# Patient Record
Sex: Male | Born: 1950
Health system: Southern US, Community
[De-identification: ages and names within clinical notes are randomized; demographics above are authoritative.]

## PROBLEM LIST (undated history)

## (undated) DIAGNOSIS — E119 Type 2 diabetes mellitus without complications: Secondary | ICD-10-CM

## (undated) DIAGNOSIS — E7800 Pure hypercholesterolemia, unspecified: Secondary | ICD-10-CM

## (undated) DIAGNOSIS — I10 Essential (primary) hypertension: Secondary | ICD-10-CM

---

## 2000-02-02 ENCOUNTER — Encounter: Payer: Self-pay | Admitting: Family Medicine

## 2000-02-02 ENCOUNTER — Ambulatory Visit (HOSPITAL_COMMUNITY): Admission: RE | Admit: 2000-02-02 | Discharge: 2000-02-02 | Payer: Self-pay | Admitting: Family Medicine

## 2003-02-09 ENCOUNTER — Ambulatory Visit (HOSPITAL_COMMUNITY): Admission: RE | Admit: 2003-02-09 | Discharge: 2003-02-09 | Payer: Self-pay | Admitting: Gastroenterology

## 2009-09-02 ENCOUNTER — Encounter: Admission: RE | Admit: 2009-09-02 | Discharge: 2009-09-02 | Payer: Self-pay | Admitting: Internal Medicine

## 2010-08-19 NOTE — Op Note (Signed)
   William Andrews, William Andrews                      ACCOUNT NO.:  [id number]   MEDICAL RECORD NO.:  [id number]                   PATIENT TYPE:  AMB   LOCATION:  ENDO                                 FACILITY:  [facility]   PHYSICIAN:  Graylin Shiver, M.D.                DATE OF BIRTH:  04-27-1950   DATE OF PROCEDURE:  02/09/2003  DATE OF DISCHARGE:                                 OPERATIVE REPORT   PROCEDURE:  Colonoscopy.   INDICATIONS:  Rectal bleeding.   Informed consent was obtained after explanation of the risks of bleeding,  infection and perforation.   PREMEDICATION:  Fentanyl 70 mcg IV, Versed 7 mg IV.   DESCRIPTION OF PROCEDURE:  With the patient in the left lateral decubitus  position, a rectal exam was performed and no masses were felt.  The Olympus  colonoscope was inserted into the rectum and advanced around the colon to  the cecum.  The cecal landmarks were identified.  The cecum and ascending  colon were normal.  The transverse colon was normal.  The descending colon  was normal.  There were a couple of diverticulum noted in the sigmoid.  The  rectum was normal.  There was a little redness in the anal canal but no  obvious large hemorrhoids.  He tolerated the procedure well without  complications.   IMPRESSION:  Diverticulosis.   COMMENT:  I suspect that this patient's intermittent rectal bleeding is due  to some anal irritation.  There is no obvious abnormality such as large  hemorrhoids.                                               Graylin Shiver, M.D.    SFG/MEDQ  D:  02/09/2003  T:  02/09/2003  Job:  [id number]   cc:   Kyung Rudd  [address]  [place]  [place] [address]  Fax: [phone]

## 2013-02-13 ENCOUNTER — Other Ambulatory Visit: Payer: Self-pay | Admitting: Internal Medicine

## 2013-02-13 ENCOUNTER — Ambulatory Visit
Admission: RE | Admit: 2013-02-13 | Discharge: 2013-02-13 | Disposition: A | Discharge status: Home / Self Care | Payer: 59 | Source: Ambulatory Visit | Attending: Internal Medicine | Admitting: Internal Medicine

## 2013-02-13 DIAGNOSIS — M25521 Pain in right elbow: Secondary | ICD-10-CM

## 2013-10-25 ENCOUNTER — Emergency Department (HOSPITAL_COMMUNITY)
Admission: EM | Admit: 2013-10-25 | Discharge: 2013-10-25 | Discharge status: Absent without leave | Payer: 59 | Source: Home / Self Care

## 2014-08-18 ENCOUNTER — Emergency Department (HOSPITAL_COMMUNITY)
Admission: EM | Admit: 2014-08-18 | Discharge: 2014-08-18 | Disposition: A | Discharge status: Home / Self Care | Payer: No Typology Code available for payment source | Attending: Emergency Medicine | Admitting: Emergency Medicine

## 2014-08-18 ENCOUNTER — Encounter (HOSPITAL_COMMUNITY): Payer: Self-pay

## 2014-08-18 DIAGNOSIS — M62838 Other muscle spasm: Secondary | ICD-10-CM

## 2014-08-18 DIAGNOSIS — Y92410 Unspecified street and highway as the place of occurrence of the external cause: Secondary | ICD-10-CM | POA: Insufficient documentation

## 2014-08-18 DIAGNOSIS — S4991XA Unspecified injury of right shoulder and upper arm, initial encounter: Secondary | ICD-10-CM | POA: Insufficient documentation

## 2014-08-18 DIAGNOSIS — S199XXA Unspecified injury of neck, initial encounter: Secondary | ICD-10-CM | POA: Insufficient documentation

## 2014-08-18 DIAGNOSIS — Y9389 Activity, other specified: Secondary | ICD-10-CM | POA: Diagnosis not present

## 2014-08-18 DIAGNOSIS — I10 Essential (primary) hypertension: Secondary | ICD-10-CM | POA: Insufficient documentation

## 2014-08-18 DIAGNOSIS — Z87891 Personal history of nicotine dependence: Secondary | ICD-10-CM | POA: Diagnosis not present

## 2014-08-18 DIAGNOSIS — Y998 Other external cause status: Secondary | ICD-10-CM | POA: Diagnosis not present

## 2014-08-18 DIAGNOSIS — Z8639 Personal history of other endocrine, nutritional and metabolic disease: Secondary | ICD-10-CM | POA: Insufficient documentation

## 2014-08-18 HISTORY — DX: Pure hypercholesterolemia, unspecified: E78.00

## 2014-08-18 HISTORY — DX: Essential (primary) hypertension: I10

## 2014-08-18 MED ORDER — OXYCODONE-ACETAMINOPHEN 5-325 MG PO TABS
1.0000 | ORAL_TABLET | Freq: Once | ORAL | Status: AC
  Administered 2014-08-18: 1 via ORAL
  Filled 2014-08-18: qty 1

## 2014-08-18 MED ORDER — OXYCODONE-ACETAMINOPHEN 5-325 MG PO TABS: ORAL_TABLET | ORAL | Status: AC
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
## 2014-08-20 ENCOUNTER — Emergency Department (HOSPITAL_COMMUNITY)
Admission: EM | Admit: 2014-08-20 | Discharge: 2014-08-20 | Disposition: A | Discharge status: Home / Self Care | Payer: No Typology Code available for payment source | Attending: Emergency Medicine | Admitting: Emergency Medicine

## 2014-08-20 ENCOUNTER — Encounter (HOSPITAL_COMMUNITY): Payer: Self-pay | Admitting: Emergency Medicine

## 2014-08-20 DIAGNOSIS — Z87891 Personal history of nicotine dependence: Secondary | ICD-10-CM | POA: Insufficient documentation

## 2014-08-20 DIAGNOSIS — M25511 Pain in right shoulder: Secondary | ICD-10-CM | POA: Insufficient documentation

## 2014-08-20 DIAGNOSIS — Z8639 Personal history of other endocrine, nutritional and metabolic disease: Secondary | ICD-10-CM | POA: Diagnosis not present

## 2014-08-20 DIAGNOSIS — I10 Essential (primary) hypertension: Secondary | ICD-10-CM | POA: Diagnosis not present

## 2014-08-20 DIAGNOSIS — R51 Headache: Secondary | ICD-10-CM | POA: Insufficient documentation

## 2014-08-20 DIAGNOSIS — R42 Dizziness and giddiness: Secondary | ICD-10-CM | POA: Insufficient documentation

## 2014-08-20 DIAGNOSIS — M25512 Pain in left shoulder: Secondary | ICD-10-CM | POA: Diagnosis present

## 2014-08-20 DIAGNOSIS — R001 Bradycardia, unspecified: Secondary | ICD-10-CM | POA: Diagnosis not present

## 2014-08-20 DIAGNOSIS — S29012D Strain of muscle and tendon of back wall of thorax, subsequent encounter: Secondary | ICD-10-CM | POA: Diagnosis not present

## 2014-08-20 DIAGNOSIS — M542 Cervicalgia: Secondary | ICD-10-CM | POA: Diagnosis not present

## 2014-08-20 DIAGNOSIS — S060X0D Concussion without loss of consciousness, subsequent encounter: Secondary | ICD-10-CM | POA: Diagnosis not present

## 2014-08-20 MED ORDER — METHOCARBAMOL 500 MG PO TABS: 500.0000 mg | ORAL_TABLET | Freq: Two times a day (BID) | ORAL | Status: AC

## 2014-08-20 NOTE — Discharge Instructions (Signed)
Concussion °Direct trauma to the head often causes a condition known as a concussion. This injury can temporarily interfere with brain function and may cause you to pass out (lose consciousness). The consequences of a concussion are usually short-term, but repetitive concussions can be very dangerous. If you have multiple concussions, you will have a greater risk of long-term effects, such as slurred speech, slow movements, impaired thinking, or tremors. The severity of a concussion is based on the length and severity of the interference with brain activity. °SYMPTOMS  °Symptoms of a concussion vary depending on the severity of the injury. Very mild concussions may even occur without any noticeable symptoms. Swelling in the area of the injury is not related to the seriousness of the injury.  °· Mild concussion: °¨ Temporary loss of consciousness may or may not occur. °¨ Memory loss (amnesia) for a short time. °¨ Emotional instability. °¨ Confusion. °· Severe concussion: °¨ Usually prolonged loss of consciousness. °¨ Confusion °¨ One pupil (the black part in the middle of the eye) is larger than the other. °¨ Changes in vision (including blurring). °¨ Changes in breathing. °¨ Disturbed balance (equilibrium). °¨ Headaches. °¨ Confusion. °¨ Nausea or vomiting. °¨ Slower reaction time than normal. °¨ Difficulty learning and remembering things you have heard. °CAUSES  °A concussion is the result of trauma to the head. When the head is subjected to such an injury, the brain strikes against the inner wall of the skull. This impact is what causes the damage to the brain. The force of injury is related to severity of injury. The most severe concussions are associated with incidents that involve large impact forces such as motor vehicle accidents. Wearing a helmet will reduce the severity of trauma to the head, but concussions may still occur if you are wearing a helmet. °RISK INCREASES WITH: °· Contact sports (football,  hockey, soccer, rugby, basketball or lacrosse). °· Fighting sports (martial arts or boxing). °· Riding bicycles, motorcycles, or horses (when you ride without a helmet). °PREVENTION °· Wear proper protective headgear and ensure correct fit. °· Wear seat belts when driving and riding in a car. °· Do not drink or use mind-altering drugs and drive. °PROGNOSIS  °Concussions are typically curable if they are recognized and treated early. If a severe concussion or multiple concussions go untreated, then the complications may be life-threatening or cause permanent disability and brain damage. °RELATED COMPLICATIONS  °· Permanent brain damage (slurred speech, slow movement, impaired thinking, or tremors). °· Bleeding under the skull (subdural hemorrhage or hematoma, epidural hematoma). °· Bleeding into the brain. °· Prolonged healing time if usual activities are resumed too soon. °· Infection if skin over the concussion site is broken. °· Increased risk of future concussions (less trauma is required for a second concussion than the first). °TREATMENT  °Treatment initially requires immediate evaluation to determine the severity of the concussion. Occasionally, a hospital stay may be required for observation and treatment.  °Avoid exertion. Bed rest for the first 24-48 hours is recommended.  °Return to play is a controversial subject due to the increased risk for future injury as well as permanent disability and should be discussed at length with your treating caregiver. Many factors such as the severity of the concussion and whether this is the first, second, or third concussion play a role in timing a patient's return to sports.  °MEDICATION  °Do not give any medicine, including non-prescription acetaminophen or aspirin, until the diagnosis is certain. These medicines may mask developing   symptoms.  °SEEK IMMEDIATE MEDICAL CARE IF:  °· Symptoms get worse or do not improve in 24 hours. °· Any of the following symptoms  occur: °¨ Vomiting. °¨ The inability to move arms and legs equally well on both sides. °¨ Fever. °¨ Neck stiffness. °¨ Pupils of unequal size, shape, or reactivity. °¨ Convulsions. °¨ Noticeable restlessness. °¨ Severe headache that persists for longer than 4 hours after injury. °¨ Confusion, disorientation, or mental status changes. °Document Released: 03/20/2005 Document Revised: 01/08/2013 Document Reviewed: 07/02/2008 °[employer]® Patient Information ©2015 [employer]. This information is not intended to replace advice given to you by your health care provider. Make sure you discuss any questions you have with your health care provider. ° °
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
## 2014-12-08 ENCOUNTER — Other Ambulatory Visit: Payer: Self-pay | Admitting: Internal Medicine

## 2014-12-08 DIAGNOSIS — M5416 Radiculopathy, lumbar region: Secondary | ICD-10-CM

## 2014-12-10 ENCOUNTER — Other Ambulatory Visit: Payer: Self-pay | Admitting: Internal Medicine

## 2014-12-10 ENCOUNTER — Ambulatory Visit
Admission: RE | Admit: 2014-12-10 | Discharge: 2014-12-10 | Disposition: A | Discharge status: Home / Self Care | Payer: 59 | Source: Ambulatory Visit | Attending: Internal Medicine | Admitting: Internal Medicine

## 2014-12-10 DIAGNOSIS — M5416 Radiculopathy, lumbar region: Secondary | ICD-10-CM

## 2014-12-10 DIAGNOSIS — Z139 Encounter for screening, unspecified: Secondary | ICD-10-CM

## 2014-12-10 DIAGNOSIS — Z77018 Contact with and (suspected) exposure to other hazardous metals: Secondary | ICD-10-CM

## 2016-04-29 IMAGING — CR DG ORBITS FOR FOREIGN BODY
2 series · 2 of 2 positions shown · non-contrast
Comparison: None.

CLINICAL DATA: Metal working/exposure; clearance prior to MRI

EXAM:
ORBITS FOR FOREIGN BODY - 2 VIEW

[w orbit pa (1 of 2)]
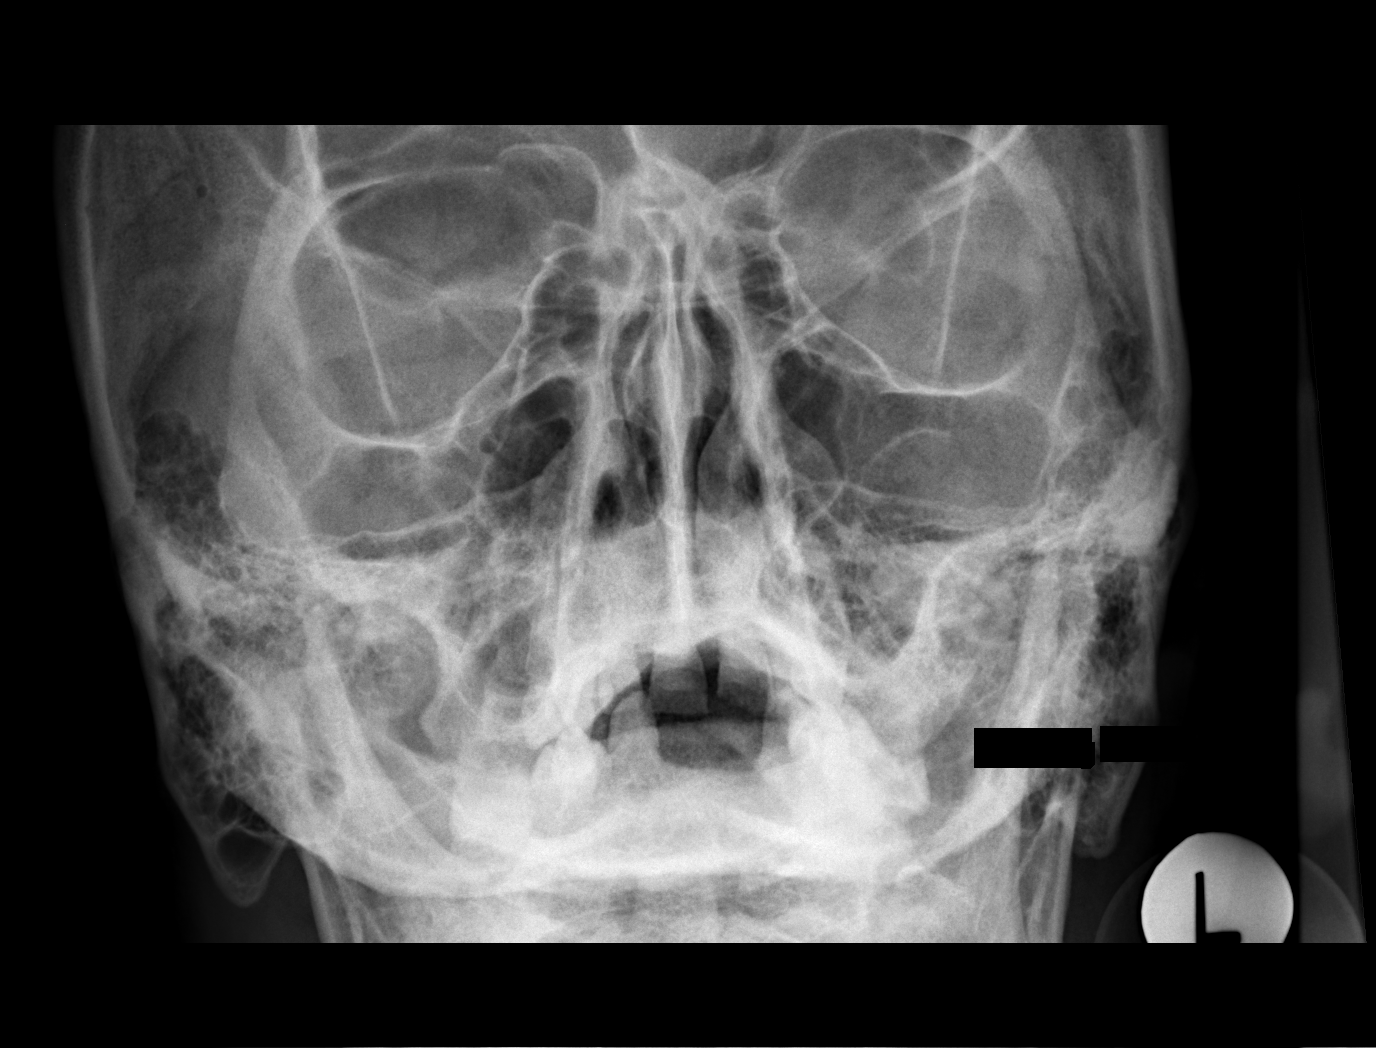

[w orbit pa (2 of 2)]
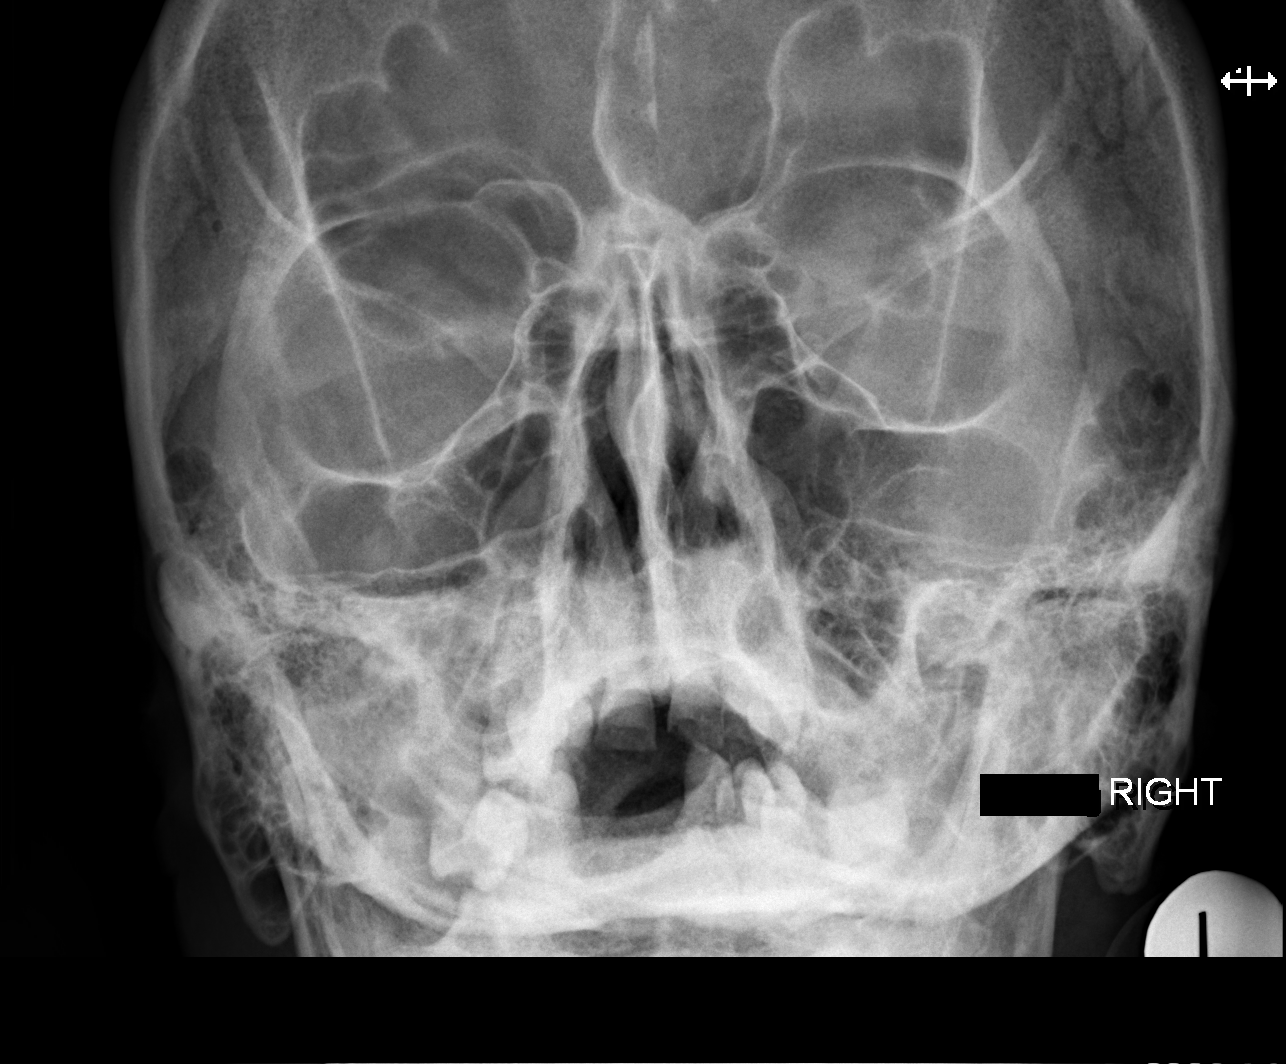

[2 of 2 positions shown; findings below may reference images not displayed]

FINDINGS: No metallic foreign bodies noted. No focal bony abnormality.
Paranasal sinuses are clear .
IMPRESSION: No evidence of metallic foreign body within the orbits.

## 2017-11-19 DIAGNOSIS — J310 Chronic rhinitis: Secondary | ICD-10-CM | POA: Diagnosis not present

## 2017-11-19 DIAGNOSIS — I10 Essential (primary) hypertension: Secondary | ICD-10-CM | POA: Diagnosis not present

## 2017-11-19 DIAGNOSIS — E782 Mixed hyperlipidemia: Secondary | ICD-10-CM | POA: Diagnosis not present

## 2017-11-19 DIAGNOSIS — N182 Chronic kidney disease, stage 2 (mild): Secondary | ICD-10-CM | POA: Diagnosis not present

## 2017-11-19 DIAGNOSIS — R69 Illness, unspecified: Secondary | ICD-10-CM | POA: Diagnosis not present

## 2018-01-09 DIAGNOSIS — N182 Chronic kidney disease, stage 2 (mild): Secondary | ICD-10-CM | POA: Diagnosis not present

## 2018-01-09 DIAGNOSIS — Z1159 Encounter for screening for other viral diseases: Secondary | ICD-10-CM | POA: Diagnosis not present

## 2018-01-09 DIAGNOSIS — J310 Chronic rhinitis: Secondary | ICD-10-CM | POA: Diagnosis not present

## 2018-01-09 DIAGNOSIS — Z0000 Encounter for general adult medical examination without abnormal findings: Secondary | ICD-10-CM | POA: Diagnosis not present

## 2018-01-09 DIAGNOSIS — M519 Unspecified thoracic, thoracolumbar and lumbosacral intervertebral disc disorder: Secondary | ICD-10-CM | POA: Diagnosis not present

## 2018-01-09 DIAGNOSIS — R7309 Other abnormal glucose: Secondary | ICD-10-CM | POA: Diagnosis not present

## 2018-01-09 DIAGNOSIS — E782 Mixed hyperlipidemia: Secondary | ICD-10-CM | POA: Diagnosis not present

## 2018-01-09 DIAGNOSIS — Z125 Encounter for screening for malignant neoplasm of prostate: Secondary | ICD-10-CM | POA: Diagnosis not present

## 2018-01-09 DIAGNOSIS — I10 Essential (primary) hypertension: Secondary | ICD-10-CM | POA: Diagnosis not present

## 2018-01-09 DIAGNOSIS — Z23 Encounter for immunization: Secondary | ICD-10-CM | POA: Diagnosis not present

## 2018-05-13 DIAGNOSIS — R7303 Prediabetes: Secondary | ICD-10-CM | POA: Diagnosis not present

## 2018-05-13 DIAGNOSIS — R972 Elevated prostate specific antigen [PSA]: Secondary | ICD-10-CM | POA: Diagnosis not present

## 2018-07-03 ENCOUNTER — Other Ambulatory Visit: Payer: Self-pay

## 2018-07-03 ENCOUNTER — Encounter (HOSPITAL_COMMUNITY): Payer: Self-pay | Admitting: Emergency Medicine

## 2018-07-03 ENCOUNTER — Emergency Department (HOSPITAL_COMMUNITY)
Admission: EM | Admit: 2018-07-03 | Discharge: 2018-07-03 | Disposition: A | Discharge status: Home / Self Care | Payer: Medicare HMO | Attending: Emergency Medicine | Admitting: Emergency Medicine

## 2018-07-03 DIAGNOSIS — I10 Essential (primary) hypertension: Secondary | ICD-10-CM | POA: Diagnosis not present

## 2018-07-03 DIAGNOSIS — T464X5A Adverse effect of angiotensin-converting-enzyme inhibitors, initial encounter: Secondary | ICD-10-CM | POA: Diagnosis not present

## 2018-07-03 DIAGNOSIS — R220 Localized swelling, mass and lump, head: Secondary | ICD-10-CM | POA: Diagnosis not present

## 2018-07-03 DIAGNOSIS — E119 Type 2 diabetes mellitus without complications: Secondary | ICD-10-CM | POA: Insufficient documentation

## 2018-07-03 DIAGNOSIS — K130 Diseases of lips: Secondary | ICD-10-CM | POA: Insufficient documentation

## 2018-07-03 DIAGNOSIS — Z87891 Personal history of nicotine dependence: Secondary | ICD-10-CM | POA: Diagnosis not present

## 2018-07-03 DIAGNOSIS — Z79899 Other long term (current) drug therapy: Secondary | ICD-10-CM | POA: Diagnosis not present

## 2018-07-03 DIAGNOSIS — T783XXA Angioneurotic edema, initial encounter: Secondary | ICD-10-CM

## 2018-07-03 HISTORY — DX: Type 2 diabetes mellitus without complications: E11.9

## 2018-07-03 LAB — BASIC METABOLIC PANEL
Anion gap: 12 (ref 5–15)
BUN: 18 mg/dL (ref 8–23)
CO2: 21 mmol/L — ABNORMAL LOW (ref 22–32)
Calcium: 9.4 mg/dL (ref 8.9–10.3)
Chloride: 103 mmol/L (ref 98–111)
Creatinine, Ser: 1.34 mg/dL — ABNORMAL HIGH (ref 0.61–1.24)
GFR calc Af Amer: >60 mL/min (ref >60)
GFR calc non Af Amer: 55 mL/min — ABNORMAL LOW (ref >60)
Glucose, Bld: 119 mg/dL — ABNORMAL HIGH (ref 70–99)
Potassium: 4.1 mmol/L (ref 3.5–5.1)
Sodium: 136 mmol/L (ref 135–145)

## 2018-07-03 LAB — CBC
HCT: 39.7 % (ref 39.0–52.0)
Hemoglobin: 12.4 g/dL — ABNORMAL LOW (ref 13.0–17.0)
MCH: 26.4 pg (ref 26.0–34.0)
MCHC: 31.2 g/dL (ref 30.0–36.0)
MCV: 84.6 fL (ref 80.0–100.0)
Platelets: 387 10*3/uL (ref 150–400)
RBC: 4.69 MIL/uL (ref 4.22–5.81)
RDW: 12.3 % (ref 11.5–15.5)
WBC: 9.7 10*3/uL (ref 4.0–10.5)
nRBC: 0 % (ref 0.0–0.2)

## 2018-07-03 MED ORDER — PREDNISONE 20 MG PO TABS: 40.0000 mg | ORAL_TABLET | Freq: Every day | ORAL | 0 refills | Status: AC

## 2018-07-03 MED ORDER — HYDROCHLOROTHIAZIDE 25 MG PO TABS: 25.0000 mg | ORAL_TABLET | Freq: Every day | ORAL | 2 refills | Status: AC

## 2018-07-03 MED ORDER — METHYLPREDNISOLONE SODIUM SUCC 125 MG IJ SOLR
125.0000 mg | Freq: Once | INTRAMUSCULAR | Status: AC
  Administered 2018-07-03: 125 mg via INTRAVENOUS
  Filled 2018-07-03: qty 2

## 2018-07-03 MED ORDER — DIPHENHYDRAMINE HCL 50 MG/ML IJ SOLN
25.0000 mg | Freq: Once | INTRAMUSCULAR | Status: AC
  Administered 2018-07-03: 25 mg via INTRAVENOUS
  Filled 2018-07-03: qty 1

## 2018-07-03 MED ORDER — FAMOTIDINE 20 MG IN NS 100 ML IVPB
20.0000 mg | Freq: Once | INTRAVENOUS | Status: AC
  Administered 2018-07-03: 20 mg via INTRAVENOUS
  Filled 2018-07-03: qty 100

## 2018-07-03 NOTE — ED Provider Notes (Signed)
Pt feeling improved.  States bottom lip is going down.  Still has upper lip and right cheek swelling.  No tongue or throat edema.  Will discontinue lisinopril.  Given ppx for HCTZ.  Pt to call his PCP today to inform of change.  Pt also given 3 days of prednisone for possible allergic component.   Gwyneth Sprout, MD 07/03/18 [phone]

## 2018-07-03 NOTE — Discharge Instructions (Addendum)
Discontinue your Lisinopril.  Contact your doctor and notify him of the reaction you had to the Lisinopril.  Return for new or worsening symptoms. If start feeling swelling of the tongue or throat or lips get worse.

## 2018-07-03 NOTE — ED Notes (Signed)
Pt's airway still secure. No complaints of swelling in throat or shob

## 2018-07-03 NOTE — ED Triage Notes (Signed)
Pt coming in after waking up in the middle of the night with lip swelling. Patient states he is un sure of what could have called the swelling- had fish last night for dinner. Pt can swallow and speak fine.

## 2018-07-03 NOTE — ED Provider Notes (Signed)
[facility] EMERGENCY DEPARTMENT Provider Note   CSN: [id number] Arrival date & time: 07/03/18  0325    History   Chief Complaint Chief Complaint  Patient presents with  . Oral Swelling    HPI William Andrews is a 69 y.o. male.     Patient presents to the ED with a chief complaint of lip swelling.  He states that the symptoms started tonight.  He takes Lisinopril, and has done so for years.  He states that he at fish for dinner, but denies any other associated symptoms.  Denies any SOB, rash, throat swelling sensation, n/v/d.  He has not taken anything for the symptoms.  The history is provided by the patient. No language interpreter was used.    Past Medical History:  Diagnosis Date  . Diabetes mellitus without complication (HCC)   . Hypercholesteremia   . Hypertension     There are no active problems to display for this patient.   No past surgical history on file.      Home Medications    Prior to Admission medications   Medication Sig Start Date End Date Taking? Authorizing Provider  methocarbamol (ROBAXIN) 500 MG tablet Take 1 tablet (500 mg total) by mouth 2 (two) times daily. 08/20/14   Fayrene Helper, PA-C  oxyCODONE-acetaminophen (PERCOCET/ROXICET) 5-325 MG per tablet 1 to 2 tabs PO q6hrs  PRN for pain 08/18/14   Pisciotta, Joni Reining, PA-C    Family History No family history on file.  Social History Social History   Tobacco Use  . Smoking status: Former Smoker  Substance Use Topics  . Alcohol use: No  . Drug use: No     Allergies   Patient has no known allergies.   Review of Systems Review of Systems  All other systems reviewed and are negative.    Physical Exam Updated Vital Signs BP (!) 163/92 (BP Location: Right Arm) Comment: Simultaneous filing. User may not have seen previous data.  Pulse 83 Comment: Simultaneous filing. User may not have seen previous data.  Temp 98.2 F (36.8 C) (Oral)   Resp 18   Ht 6' (1.829  m)   Wt 95.3 kg   SpO2 100% Comment: Simultaneous filing. User may not have seen previous data.  BMI 28.48 kg/m   Physical Exam Vitals signs and nursing note reviewed.  Constitutional:      Appearance: He is well-developed.  HENT:     Head: Normocephalic and atraumatic.     Mouth/Throat:     Comments: Lip swelling as pictured, consistent with angioedema, no tongue swelling, no stridor, airway is clear Eyes:     General: No scleral icterus.       Right eye: No discharge.        Left eye: No discharge.     Conjunctiva/sclera: Conjunctivae normal.     Pupils: Pupils are equal, round, and reactive to light.  Neck:     Musculoskeletal: Normal range of motion and neck supple.     Vascular: No JVD.  Cardiovascular:     Rate and Rhythm: Normal rate and regular rhythm.     Heart sounds: Normal heart sounds. No murmur. No friction rub. No gallop.   Pulmonary:     Effort: Pulmonary effort is normal. No respiratory distress.     Breath sounds: Normal breath sounds. No wheezing or rales.  Chest:     Chest wall: No tenderness.  Abdominal:     General: There is no distension.  Palpations: Abdomen is soft. There is no mass.     Tenderness: There is no abdominal tenderness. There is no guarding or rebound.  Musculoskeletal: Normal range of motion.        General: No tenderness.  Skin:    General: Skin is warm and dry.  Neurological:     Mental Status: He is alert and oriented to person, place, and time.  Psychiatric:        Behavior: Behavior normal.        Thought Content: Thought content normal.        Judgment: Judgment normal.          ED Treatments / Results  Labs (all labs ordered are listed, but only abnormal results are displayed) Labs Reviewed - No data to display  EKG None  Radiology No results found.  Procedures Procedures (including critical care time)  Medications Ordered in ED Medications  methylPREDNISolone sodium succinate (SOLU-MEDROL) 125 mg/2  mL injection 125 mg (has no administration in time range)  diphenhydrAMINE (BENADRYL) injection 25 mg (has no administration in time range)  famotidine (PEPCID) IVPB 20 mg premix (has no administration in time range)     Initial Impression / Assessment and Plan / ED Course  I have reviewed the triage vital signs and the nursing notes.  Pertinent labs & imaging results that were available during my care of the patient were reviewed by me and considered in my medical decision making (see chart for details).       Patient with lips swelling.  Sudden onset.  Woke up with it this morning.  Takes Lisinopril.  No other signs of allergic reaction, no rash, throat swelling, respiratory symptoms, or GI symptoms.  Will give solumedrol, benadryl, and pepcid. Will obs in ED for several hours.  Plan for discharge if not worsening after observation.  5:26 AM Reassessed.  Similar appearance, no worse.  Patient signed out to [place], PA-C, who will continue care.  Anticipate DC at 0800.  Final Clinical Impressions(s) / ED Diagnoses   Final diagnoses:  Angiotensin converting enzyme inhibitor-aggravated angioedema, initial encounter    ED Discharge Orders    None       Roxy Horseman, PA-C 07/03/18 [id number]    Nira Conn, MD 07/03/18 [phone]

## 2018-07-10 DIAGNOSIS — N182 Chronic kidney disease, stage 2 (mild): Secondary | ICD-10-CM | POA: Diagnosis not present

## 2018-07-10 DIAGNOSIS — I10 Essential (primary) hypertension: Secondary | ICD-10-CM | POA: Diagnosis not present

## 2018-07-10 DIAGNOSIS — R7303 Prediabetes: Secondary | ICD-10-CM | POA: Diagnosis not present

## 2018-07-10 DIAGNOSIS — J309 Allergic rhinitis, unspecified: Secondary | ICD-10-CM | POA: Diagnosis not present

## 2018-07-10 DIAGNOSIS — E782 Mixed hyperlipidemia: Secondary | ICD-10-CM | POA: Diagnosis not present

## 2018-07-15 DIAGNOSIS — I10 Essential (primary) hypertension: Secondary | ICD-10-CM | POA: Diagnosis not present

## 2018-08-28 DIAGNOSIS — N182 Chronic kidney disease, stage 2 (mild): Secondary | ICD-10-CM | POA: Diagnosis not present

## 2018-08-28 DIAGNOSIS — I10 Essential (primary) hypertension: Secondary | ICD-10-CM | POA: Diagnosis not present

## 2019-01-27 DIAGNOSIS — R7309 Other abnormal glucose: Secondary | ICD-10-CM | POA: Diagnosis not present

## 2019-01-27 DIAGNOSIS — E782 Mixed hyperlipidemia: Secondary | ICD-10-CM | POA: Diagnosis not present

## 2019-01-27 DIAGNOSIS — M519 Unspecified thoracic, thoracolumbar and lumbosacral intervertebral disc disorder: Secondary | ICD-10-CM | POA: Diagnosis not present

## 2019-01-27 DIAGNOSIS — N529 Male erectile dysfunction, unspecified: Secondary | ICD-10-CM | POA: Diagnosis not present

## 2019-01-27 DIAGNOSIS — I10 Essential (primary) hypertension: Secondary | ICD-10-CM | POA: Diagnosis not present

## 2019-01-27 DIAGNOSIS — N182 Chronic kidney disease, stage 2 (mild): Secondary | ICD-10-CM | POA: Diagnosis not present

## 2019-01-27 DIAGNOSIS — N400 Enlarged prostate without lower urinary tract symptoms: Secondary | ICD-10-CM | POA: Diagnosis not present

## 2019-01-27 DIAGNOSIS — Z0000 Encounter for general adult medical examination without abnormal findings: Secondary | ICD-10-CM | POA: Diagnosis not present

## 2019-01-27 DIAGNOSIS — Z1389 Encounter for screening for other disorder: Secondary | ICD-10-CM | POA: Diagnosis not present

## 2019-07-10 DIAGNOSIS — I10 Essential (primary) hypertension: Secondary | ICD-10-CM | POA: Diagnosis not present

## 2019-07-10 DIAGNOSIS — N400 Enlarged prostate without lower urinary tract symptoms: Secondary | ICD-10-CM | POA: Diagnosis not present

## 2019-07-10 DIAGNOSIS — N182 Chronic kidney disease, stage 2 (mild): Secondary | ICD-10-CM | POA: Diagnosis not present

## 2019-07-10 DIAGNOSIS — E782 Mixed hyperlipidemia: Secondary | ICD-10-CM | POA: Diagnosis not present

## 2019-07-28 DIAGNOSIS — J309 Allergic rhinitis, unspecified: Secondary | ICD-10-CM | POA: Diagnosis not present

## 2019-07-28 DIAGNOSIS — N182 Chronic kidney disease, stage 2 (mild): Secondary | ICD-10-CM | POA: Diagnosis not present

## 2019-07-28 DIAGNOSIS — E782 Mixed hyperlipidemia: Secondary | ICD-10-CM | POA: Diagnosis not present

## 2019-07-28 DIAGNOSIS — I10 Essential (primary) hypertension: Secondary | ICD-10-CM | POA: Diagnosis not present

## 2019-07-28 DIAGNOSIS — R7303 Prediabetes: Secondary | ICD-10-CM | POA: Diagnosis not present

## 2019-08-27 DIAGNOSIS — N400 Enlarged prostate without lower urinary tract symptoms: Secondary | ICD-10-CM | POA: Diagnosis not present

## 2019-08-27 DIAGNOSIS — N182 Chronic kidney disease, stage 2 (mild): Secondary | ICD-10-CM | POA: Diagnosis not present

## 2019-08-27 DIAGNOSIS — I10 Essential (primary) hypertension: Secondary | ICD-10-CM | POA: Diagnosis not present

## 2019-08-27 DIAGNOSIS — E782 Mixed hyperlipidemia: Secondary | ICD-10-CM | POA: Diagnosis not present

## 2019-10-16 DIAGNOSIS — N182 Chronic kidney disease, stage 2 (mild): Secondary | ICD-10-CM | POA: Diagnosis not present

## 2019-10-16 DIAGNOSIS — E782 Mixed hyperlipidemia: Secondary | ICD-10-CM | POA: Diagnosis not present

## 2019-10-16 DIAGNOSIS — I10 Essential (primary) hypertension: Secondary | ICD-10-CM | POA: Diagnosis not present

## 2019-10-16 DIAGNOSIS — N400 Enlarged prostate without lower urinary tract symptoms: Secondary | ICD-10-CM | POA: Diagnosis not present

## 2019-12-01 DIAGNOSIS — N400 Enlarged prostate without lower urinary tract symptoms: Secondary | ICD-10-CM | POA: Diagnosis not present

## 2019-12-01 DIAGNOSIS — I10 Essential (primary) hypertension: Secondary | ICD-10-CM | POA: Diagnosis not present

## 2019-12-01 DIAGNOSIS — E782 Mixed hyperlipidemia: Secondary | ICD-10-CM | POA: Diagnosis not present

## 2019-12-01 DIAGNOSIS — N182 Chronic kidney disease, stage 2 (mild): Secondary | ICD-10-CM | POA: Diagnosis not present

## 2020-01-08 DIAGNOSIS — E782 Mixed hyperlipidemia: Secondary | ICD-10-CM | POA: Diagnosis not present

## 2020-01-08 DIAGNOSIS — N400 Enlarged prostate without lower urinary tract symptoms: Secondary | ICD-10-CM | POA: Diagnosis not present

## 2020-01-08 DIAGNOSIS — N182 Chronic kidney disease, stage 2 (mild): Secondary | ICD-10-CM | POA: Diagnosis not present

## 2020-01-08 DIAGNOSIS — I10 Essential (primary) hypertension: Secondary | ICD-10-CM | POA: Diagnosis not present

## 2020-01-29 DIAGNOSIS — N400 Enlarged prostate without lower urinary tract symptoms: Secondary | ICD-10-CM | POA: Diagnosis not present

## 2020-01-29 DIAGNOSIS — J309 Allergic rhinitis, unspecified: Secondary | ICD-10-CM | POA: Diagnosis not present

## 2020-01-29 DIAGNOSIS — Z0000 Encounter for general adult medical examination without abnormal findings: Secondary | ICD-10-CM | POA: Diagnosis not present

## 2020-01-29 DIAGNOSIS — I10 Essential (primary) hypertension: Secondary | ICD-10-CM | POA: Diagnosis not present

## 2020-01-29 DIAGNOSIS — L0292 Furuncle, unspecified: Secondary | ICD-10-CM | POA: Diagnosis not present

## 2020-01-29 DIAGNOSIS — N529 Male erectile dysfunction, unspecified: Secondary | ICD-10-CM | POA: Diagnosis not present

## 2020-01-29 DIAGNOSIS — R7309 Other abnormal glucose: Secondary | ICD-10-CM | POA: Diagnosis not present

## 2020-01-29 DIAGNOSIS — Z1389 Encounter for screening for other disorder: Secondary | ICD-10-CM | POA: Diagnosis not present

## 2020-01-29 DIAGNOSIS — Z23 Encounter for immunization: Secondary | ICD-10-CM | POA: Diagnosis not present

## 2020-01-29 DIAGNOSIS — E782 Mixed hyperlipidemia: Secondary | ICD-10-CM | POA: Diagnosis not present

## 2020-01-29 DIAGNOSIS — N182 Chronic kidney disease, stage 2 (mild): Secondary | ICD-10-CM | POA: Diagnosis not present

## 2020-02-20 DIAGNOSIS — N400 Enlarged prostate without lower urinary tract symptoms: Secondary | ICD-10-CM | POA: Diagnosis not present

## 2020-02-20 DIAGNOSIS — N182 Chronic kidney disease, stage 2 (mild): Secondary | ICD-10-CM | POA: Diagnosis not present

## 2020-02-20 DIAGNOSIS — I10 Essential (primary) hypertension: Secondary | ICD-10-CM | POA: Diagnosis not present

## 2020-02-20 DIAGNOSIS — E782 Mixed hyperlipidemia: Secondary | ICD-10-CM | POA: Diagnosis not present

## 2020-03-09 DIAGNOSIS — E782 Mixed hyperlipidemia: Secondary | ICD-10-CM | POA: Diagnosis not present

## 2020-03-09 DIAGNOSIS — N400 Enlarged prostate without lower urinary tract symptoms: Secondary | ICD-10-CM | POA: Diagnosis not present

## 2020-03-09 DIAGNOSIS — I10 Essential (primary) hypertension: Secondary | ICD-10-CM | POA: Diagnosis not present

## 2020-03-09 DIAGNOSIS — N182 Chronic kidney disease, stage 2 (mild): Secondary | ICD-10-CM | POA: Diagnosis not present

## 2020-04-22 DIAGNOSIS — E1169 Type 2 diabetes mellitus with other specified complication: Secondary | ICD-10-CM | POA: Diagnosis not present

## 2020-04-22 DIAGNOSIS — I10 Essential (primary) hypertension: Secondary | ICD-10-CM | POA: Diagnosis not present

## 2020-04-22 DIAGNOSIS — N400 Enlarged prostate without lower urinary tract symptoms: Secondary | ICD-10-CM | POA: Diagnosis not present

## 2020-04-22 DIAGNOSIS — E1122 Type 2 diabetes mellitus with diabetic chronic kidney disease: Secondary | ICD-10-CM | POA: Diagnosis not present

## 2020-04-22 DIAGNOSIS — N182 Chronic kidney disease, stage 2 (mild): Secondary | ICD-10-CM | POA: Diagnosis not present

## 2020-04-22 DIAGNOSIS — E782 Mixed hyperlipidemia: Secondary | ICD-10-CM | POA: Diagnosis not present

## 2020-04-30 DIAGNOSIS — E1122 Type 2 diabetes mellitus with diabetic chronic kidney disease: Secondary | ICD-10-CM | POA: Diagnosis not present

## 2020-04-30 DIAGNOSIS — E782 Mixed hyperlipidemia: Secondary | ICD-10-CM | POA: Diagnosis not present

## 2020-04-30 DIAGNOSIS — E119 Type 2 diabetes mellitus without complications: Secondary | ICD-10-CM | POA: Diagnosis not present

## 2020-04-30 DIAGNOSIS — M519 Unspecified thoracic, thoracolumbar and lumbosacral intervertebral disc disorder: Secondary | ICD-10-CM | POA: Diagnosis not present

## 2020-04-30 DIAGNOSIS — I10 Essential (primary) hypertension: Secondary | ICD-10-CM | POA: Diagnosis not present

## 2020-04-30 DIAGNOSIS — Z7984 Long term (current) use of oral hypoglycemic drugs: Secondary | ICD-10-CM | POA: Diagnosis not present

## 2020-04-30 DIAGNOSIS — N182 Chronic kidney disease, stage 2 (mild): Secondary | ICD-10-CM | POA: Diagnosis not present

## 2020-05-17 DIAGNOSIS — E782 Mixed hyperlipidemia: Secondary | ICD-10-CM | POA: Diagnosis not present

## 2020-05-17 DIAGNOSIS — I10 Essential (primary) hypertension: Secondary | ICD-10-CM | POA: Diagnosis not present

## 2020-05-17 DIAGNOSIS — N182 Chronic kidney disease, stage 2 (mild): Secondary | ICD-10-CM | POA: Diagnosis not present

## 2020-05-17 DIAGNOSIS — E1169 Type 2 diabetes mellitus with other specified complication: Secondary | ICD-10-CM | POA: Diagnosis not present

## 2020-05-17 DIAGNOSIS — E1122 Type 2 diabetes mellitus with diabetic chronic kidney disease: Secondary | ICD-10-CM | POA: Diagnosis not present

## 2020-05-17 DIAGNOSIS — N400 Enlarged prostate without lower urinary tract symptoms: Secondary | ICD-10-CM | POA: Diagnosis not present

## 2020-07-14 DIAGNOSIS — N182 Chronic kidney disease, stage 2 (mild): Secondary | ICD-10-CM | POA: Diagnosis not present

## 2020-07-14 DIAGNOSIS — E1169 Type 2 diabetes mellitus with other specified complication: Secondary | ICD-10-CM | POA: Diagnosis not present

## 2020-07-14 DIAGNOSIS — E1122 Type 2 diabetes mellitus with diabetic chronic kidney disease: Secondary | ICD-10-CM | POA: Diagnosis not present

## 2020-07-14 DIAGNOSIS — E782 Mixed hyperlipidemia: Secondary | ICD-10-CM | POA: Diagnosis not present

## 2020-07-14 DIAGNOSIS — I10 Essential (primary) hypertension: Secondary | ICD-10-CM | POA: Diagnosis not present

## 2020-07-14 DIAGNOSIS — N400 Enlarged prostate without lower urinary tract symptoms: Secondary | ICD-10-CM | POA: Diagnosis not present

## 2020-08-13 DIAGNOSIS — E782 Mixed hyperlipidemia: Secondary | ICD-10-CM | POA: Diagnosis not present

## 2020-08-13 DIAGNOSIS — N182 Chronic kidney disease, stage 2 (mild): Secondary | ICD-10-CM | POA: Diagnosis not present

## 2020-08-13 DIAGNOSIS — E1169 Type 2 diabetes mellitus with other specified complication: Secondary | ICD-10-CM | POA: Diagnosis not present

## 2020-08-13 DIAGNOSIS — E1122 Type 2 diabetes mellitus with diabetic chronic kidney disease: Secondary | ICD-10-CM | POA: Diagnosis not present

## 2020-08-13 DIAGNOSIS — N400 Enlarged prostate without lower urinary tract symptoms: Secondary | ICD-10-CM | POA: Diagnosis not present

## 2020-08-13 DIAGNOSIS — I10 Essential (primary) hypertension: Secondary | ICD-10-CM | POA: Diagnosis not present

## 2020-08-23 DIAGNOSIS — N182 Chronic kidney disease, stage 2 (mild): Secondary | ICD-10-CM | POA: Diagnosis not present

## 2020-08-23 DIAGNOSIS — I10 Essential (primary) hypertension: Secondary | ICD-10-CM | POA: Diagnosis not present

## 2020-08-23 DIAGNOSIS — M519 Unspecified thoracic, thoracolumbar and lumbosacral intervertebral disc disorder: Secondary | ICD-10-CM | POA: Diagnosis not present

## 2020-08-23 DIAGNOSIS — E1122 Type 2 diabetes mellitus with diabetic chronic kidney disease: Secondary | ICD-10-CM | POA: Diagnosis not present

## 2020-08-23 DIAGNOSIS — E782 Mixed hyperlipidemia: Secondary | ICD-10-CM | POA: Diagnosis not present

## 2020-08-23 DIAGNOSIS — M542 Cervicalgia: Secondary | ICD-10-CM | POA: Diagnosis not present

## 2020-09-14 DIAGNOSIS — E1169 Type 2 diabetes mellitus with other specified complication: Secondary | ICD-10-CM | POA: Diagnosis not present

## 2020-09-14 DIAGNOSIS — E782 Mixed hyperlipidemia: Secondary | ICD-10-CM | POA: Diagnosis not present

## 2020-09-14 DIAGNOSIS — N182 Chronic kidney disease, stage 2 (mild): Secondary | ICD-10-CM | POA: Diagnosis not present

## 2020-09-14 DIAGNOSIS — E1122 Type 2 diabetes mellitus with diabetic chronic kidney disease: Secondary | ICD-10-CM | POA: Diagnosis not present

## 2020-09-14 DIAGNOSIS — I10 Essential (primary) hypertension: Secondary | ICD-10-CM | POA: Diagnosis not present

## 2020-09-14 DIAGNOSIS — N400 Enlarged prostate without lower urinary tract symptoms: Secondary | ICD-10-CM | POA: Diagnosis not present

## 2020-11-15 DIAGNOSIS — N400 Enlarged prostate without lower urinary tract symptoms: Secondary | ICD-10-CM | POA: Diagnosis not present

## 2020-11-15 DIAGNOSIS — I10 Essential (primary) hypertension: Secondary | ICD-10-CM | POA: Diagnosis not present

## 2020-11-15 DIAGNOSIS — E782 Mixed hyperlipidemia: Secondary | ICD-10-CM | POA: Diagnosis not present

## 2020-11-15 DIAGNOSIS — N182 Chronic kidney disease, stage 2 (mild): Secondary | ICD-10-CM | POA: Diagnosis not present

## 2020-11-15 DIAGNOSIS — E1122 Type 2 diabetes mellitus with diabetic chronic kidney disease: Secondary | ICD-10-CM | POA: Diagnosis not present

## 2020-11-15 DIAGNOSIS — E1169 Type 2 diabetes mellitus with other specified complication: Secondary | ICD-10-CM | POA: Diagnosis not present

## 2021-01-11 DIAGNOSIS — N400 Enlarged prostate without lower urinary tract symptoms: Secondary | ICD-10-CM | POA: Diagnosis not present

## 2021-01-11 DIAGNOSIS — N182 Chronic kidney disease, stage 2 (mild): Secondary | ICD-10-CM | POA: Diagnosis not present

## 2021-01-11 DIAGNOSIS — E782 Mixed hyperlipidemia: Secondary | ICD-10-CM | POA: Diagnosis not present

## 2021-01-11 DIAGNOSIS — E1169 Type 2 diabetes mellitus with other specified complication: Secondary | ICD-10-CM | POA: Diagnosis not present

## 2021-01-11 DIAGNOSIS — I10 Essential (primary) hypertension: Secondary | ICD-10-CM | POA: Diagnosis not present

## 2021-01-11 DIAGNOSIS — E1122 Type 2 diabetes mellitus with diabetic chronic kidney disease: Secondary | ICD-10-CM | POA: Diagnosis not present

## 2021-01-31 DIAGNOSIS — Z0000 Encounter for general adult medical examination without abnormal findings: Secondary | ICD-10-CM | POA: Diagnosis not present

## 2021-01-31 DIAGNOSIS — Z23 Encounter for immunization: Secondary | ICD-10-CM | POA: Diagnosis not present

## 2021-01-31 DIAGNOSIS — N400 Enlarged prostate without lower urinary tract symptoms: Secondary | ICD-10-CM | POA: Diagnosis not present

## 2021-01-31 DIAGNOSIS — E782 Mixed hyperlipidemia: Secondary | ICD-10-CM | POA: Diagnosis not present

## 2021-01-31 DIAGNOSIS — Z1389 Encounter for screening for other disorder: Secondary | ICD-10-CM | POA: Diagnosis not present

## 2021-01-31 DIAGNOSIS — E1169 Type 2 diabetes mellitus with other specified complication: Secondary | ICD-10-CM | POA: Diagnosis not present

## 2021-01-31 DIAGNOSIS — N182 Chronic kidney disease, stage 2 (mild): Secondary | ICD-10-CM | POA: Diagnosis not present

## 2021-01-31 DIAGNOSIS — I10 Essential (primary) hypertension: Secondary | ICD-10-CM | POA: Diagnosis not present

## 2021-03-03 DIAGNOSIS — N400 Enlarged prostate without lower urinary tract symptoms: Secondary | ICD-10-CM | POA: Diagnosis not present

## 2021-03-03 DIAGNOSIS — E1122 Type 2 diabetes mellitus with diabetic chronic kidney disease: Secondary | ICD-10-CM | POA: Diagnosis not present

## 2021-03-03 DIAGNOSIS — I10 Essential (primary) hypertension: Secondary | ICD-10-CM | POA: Diagnosis not present

## 2021-03-03 DIAGNOSIS — E782 Mixed hyperlipidemia: Secondary | ICD-10-CM | POA: Diagnosis not present

## 2021-03-03 DIAGNOSIS — N182 Chronic kidney disease, stage 2 (mild): Secondary | ICD-10-CM | POA: Diagnosis not present

## 2021-03-03 DIAGNOSIS — E1169 Type 2 diabetes mellitus with other specified complication: Secondary | ICD-10-CM | POA: Diagnosis not present

## 2021-04-07 DIAGNOSIS — E119 Type 2 diabetes mellitus without complications: Secondary | ICD-10-CM | POA: Diagnosis not present

## 2021-04-19 DIAGNOSIS — I10 Essential (primary) hypertension: Secondary | ICD-10-CM | POA: Diagnosis not present

## 2021-04-19 DIAGNOSIS — E782 Mixed hyperlipidemia: Secondary | ICD-10-CM | POA: Diagnosis not present

## 2021-07-29 DIAGNOSIS — E782 Mixed hyperlipidemia: Secondary | ICD-10-CM | POA: Diagnosis not present

## 2021-07-29 DIAGNOSIS — E1122 Type 2 diabetes mellitus with diabetic chronic kidney disease: Secondary | ICD-10-CM | POA: Diagnosis not present

## 2021-07-29 DIAGNOSIS — M519 Unspecified thoracic, thoracolumbar and lumbosacral intervertebral disc disorder: Secondary | ICD-10-CM | POA: Diagnosis not present

## 2021-07-29 DIAGNOSIS — I10 Essential (primary) hypertension: Secondary | ICD-10-CM | POA: Diagnosis not present

## 2021-07-29 DIAGNOSIS — J309 Allergic rhinitis, unspecified: Secondary | ICD-10-CM | POA: Diagnosis not present

## 2021-07-29 DIAGNOSIS — N182 Chronic kidney disease, stage 2 (mild): Secondary | ICD-10-CM | POA: Diagnosis not present

## 2021-08-16 DIAGNOSIS — N182 Chronic kidney disease, stage 2 (mild): Secondary | ICD-10-CM | POA: Diagnosis not present

## 2021-08-16 DIAGNOSIS — N400 Enlarged prostate without lower urinary tract symptoms: Secondary | ICD-10-CM | POA: Diagnosis not present

## 2021-08-16 DIAGNOSIS — I10 Essential (primary) hypertension: Secondary | ICD-10-CM | POA: Diagnosis not present

## 2021-08-16 DIAGNOSIS — E1122 Type 2 diabetes mellitus with diabetic chronic kidney disease: Secondary | ICD-10-CM | POA: Diagnosis not present

## 2021-08-16 DIAGNOSIS — E782 Mixed hyperlipidemia: Secondary | ICD-10-CM | POA: Diagnosis not present

## 2021-11-03 DIAGNOSIS — M519 Unspecified thoracic, thoracolumbar and lumbosacral intervertebral disc disorder: Secondary | ICD-10-CM | POA: Diagnosis not present

## 2021-11-03 DIAGNOSIS — E1122 Type 2 diabetes mellitus with diabetic chronic kidney disease: Secondary | ICD-10-CM | POA: Diagnosis not present

## 2021-11-03 DIAGNOSIS — N400 Enlarged prostate without lower urinary tract symptoms: Secondary | ICD-10-CM | POA: Diagnosis not present

## 2021-11-03 DIAGNOSIS — E782 Mixed hyperlipidemia: Secondary | ICD-10-CM | POA: Diagnosis not present

## 2021-11-03 DIAGNOSIS — N182 Chronic kidney disease, stage 2 (mild): Secondary | ICD-10-CM | POA: Diagnosis not present

## 2021-11-03 DIAGNOSIS — I10 Essential (primary) hypertension: Secondary | ICD-10-CM | POA: Diagnosis not present

## 2022-02-02 DIAGNOSIS — Z23 Encounter for immunization: Secondary | ICD-10-CM | POA: Diagnosis not present

## 2022-02-02 DIAGNOSIS — Z0000 Encounter for general adult medical examination without abnormal findings: Secondary | ICD-10-CM | POA: Diagnosis not present

## 2022-02-02 DIAGNOSIS — Z1331 Encounter for screening for depression: Secondary | ICD-10-CM | POA: Diagnosis not present

## 2022-04-11 DIAGNOSIS — M722 Plantar fascial fibromatosis: Secondary | ICD-10-CM | POA: Diagnosis not present

## 2022-04-11 DIAGNOSIS — M79671 Pain in right foot: Secondary | ICD-10-CM | POA: Diagnosis not present

## 2022-04-20 ENCOUNTER — Ambulatory Visit: Payer: Medicare PPO | Admitting: Podiatry

## 2022-04-24 ENCOUNTER — Ambulatory Visit (INDEPENDENT_AMBULATORY_CARE_PROVIDER_SITE_OTHER): Payer: Medicare PPO

## 2022-04-24 ENCOUNTER — Ambulatory Visit: Payer: Medicare PPO | Admitting: Podiatry

## 2022-04-24 DIAGNOSIS — M778 Other enthesopathies, not elsewhere classified: Secondary | ICD-10-CM | POA: Diagnosis not present

## 2022-04-24 DIAGNOSIS — M722 Plantar fascial fibromatosis: Secondary | ICD-10-CM | POA: Diagnosis not present

## 2022-04-24 MED ORDER — TRIAMCINOLONE ACETONIDE 10 MG/ML IJ SUSP
10.0000 mg | Freq: Once | INTRAMUSCULAR | Status: AC
  Administered 2022-04-24: 10 mg

## 2022-04-24 NOTE — Progress Notes (Unsigned)
Subjective:   Patient ID: William Andrews, male   DOB: 72 y.o.   MRN: [id number]   HPI Chief Complaint  Patient presents with   Foot Pain    Right foot, years, just started part time work,     He is on his feet 8 hours a day. No injuries. No recent treatment. It hurts worse after he gets home and sit down and stand up it hurts and also in the morning when he first gets up it hurts. After he starts walking it will ease up.   He is diabetic, on metforin. He states it is under control but he does not check his blood sugar. He is unsure of his last A1c.   ROS      Objective:  Physical Exam  ***     Assessment:  ***     Plan:  ***  Inj sinus tarsi -NS -PF brace

## 2022-04-24 NOTE — Patient Instructions (Signed)
For inserts I like Powersteps, Superfeet, [employer]  --  For instructions on how to put on your Tri-Lock Ankle Brace, please visit [email or web address]  --  Plantar Fasciitis (Heel Spur Syndrome) with Rehab The plantar fascia is a fibrous, ligament-like, soft-tissue structure that spans the bottom of the foot. Plantar fasciitis is a condition that causes pain in the foot due to inflammation of the tissue. SYMPTOMS  Pain and tenderness on the underneath side of the foot. Pain that worsens with standing or walking. CAUSES  Plantar fasciitis is caused by irritation and injury to the plantar fascia on the underneath side of the foot. Common mechanisms of injury include: Direct trauma to bottom of the foot. Damage to a small nerve that runs under the foot where the main fascia attaches to the heel bone. Stress placed on the plantar fascia due to bone spurs. RISK INCREASES WITH:  Activities that place stress on the plantar fascia (running, jumping, pivoting, or cutting). Poor strength and flexibility. Improperly fitted shoes. Tight calf muscles. Flat feet. Failure to warm-up properly before activity. Obesity. PREVENTION Warm up and stretch properly before activity. Allow for adequate recovery between workouts. Maintain physical fitness: Strength, flexibility, and endurance. Cardiovascular fitness. Maintain a health body weight. Avoid stress on the plantar fascia. Wear properly fitted shoes, including arch supports for individuals who have flat feet.  PROGNOSIS  If treated properly, then the symptoms of plantar fasciitis usually resolve without surgery. However, occasionally surgery is necessary.  RELATED COMPLICATIONS  Recurrent symptoms that may result in a chronic condition. Problems of the lower back that are caused by compensating for the injury, such as limping. Pain or weakness of the foot during push-off following surgery. Chronic inflammation, scarring, and partial or  complete fascia tear, occurring more often from repeated injections.  TREATMENT  Treatment initially involves the use of ice and medication to help reduce pain and inflammation. The use of strengthening and stretching exercises may help reduce pain with activity, especially stretches of the Achilles tendon. These exercises may be performed at home or with a therapist. Your caregiver may recommend that you use heel cups of arch supports to help reduce stress on the plantar fascia. Occasionally, corticosteroid injections are given to reduce inflammation. If symptoms persist for greater than 6 months despite non-surgical (conservative), then surgery may be recommended.   MEDICATION  If pain medication is necessary, then nonsteroidal anti-inflammatory medications, such as aspirin and ibuprofen, or other minor pain relievers, such as acetaminophen, are often recommended. Do not take pain medication within 7 days before surgery. Prescription pain relievers may be given if deemed necessary by your caregiver. Use only as directed and only as much as you need. Corticosteroid injections may be given by your caregiver. These injections should be reserved for the most serious cases, because they may only be given a certain number of times.  HEAT AND COLD Cold treatment (icing) relieves pain and reduces inflammation. Cold treatment should be applied for 10 to 15 minutes every 2 to 3 hours for inflammation and pain and immediately after any activity that aggravates your symptoms. Use ice packs or massage the area with a piece of ice (ice massage). Heat treatment may be used prior to performing the stretching and strengthening activities prescribed by your caregiver, physical therapist, or athletic trainer. Use a heat pack or soak the injury in warm water.  SEEK IMMEDIATE MEDICAL CARE IF: Treatment seems to offer no benefit, or the condition worsens. Any medications produce adverse side  effects.  EXERCISES- RANGE OF MOTION (ROM) AND STRETCHING EXERCISES - Plantar Fasciitis (Heel Spur Syndrome) These exercises may help you when beginning to rehabilitate your injury. Your symptoms may resolve with or without further involvement from your physician, physical therapist or athletic trainer. While completing these exercises, remember:  Restoring tissue flexibility helps normal motion to return to the joints. This allows healthier, less painful movement and activity. An effective stretch should be held for at least 30 seconds. A stretch should never be painful. You should only feel a gentle lengthening or release in the stretched tissue.  RANGE OF MOTION - Toe Extension, Flexion Sit with your right / left leg crossed over your opposite knee. Grasp your toes and gently pull them back toward the top of your foot. You should feel a stretch on the bottom of your toes and/or foot. Hold this stretch for 10 seconds. Now, gently pull your toes toward the bottom of your foot. You should feel a stretch on the top of your toes and or foot. Hold this stretch for 10 seconds. Repeat  times. Complete this stretch 3 times per day.   RANGE OF MOTION - Ankle Dorsiflexion, Active Assisted Remove shoes and sit on a chair that is preferably not on a carpeted surface. Place right / left foot under knee. Extend your opposite leg for support. Keeping your heel down, slide your right / left foot back toward the chair until you feel a stretch at your ankle or calf. If you do not feel a stretch, slide your bottom forward to the edge of the chair, while still keeping your heel down. Hold this stretch for 10 seconds. Repeat 3 times. Complete this stretch 2 times per day.   STRETCH  Gastroc, Standing Place hands on wall. Extend right / left leg, keeping the front knee somewhat bent. Slightly point your toes inward on your back foot. Keeping your right / left heel on the floor and your knee straight, shift  your weight toward the wall, not allowing your back to arch. You should feel a gentle stretch in the right / left calf. Hold this position for 10 seconds. Repeat 3 times. Complete this stretch 2 times per day.  STRETCH  Soleus, Standing Place hands on wall. Extend right / left leg, keeping the other knee somewhat bent. Slightly point your toes inward on your back foot. Keep your right / left heel on the floor, bend your back knee, and slightly shift your weight over the back leg so that you feel a gentle stretch deep in your back calf. Hold this position for 10 seconds. Repeat 3 times. Complete this stretch 2 times per day.  STRETCH  Gastrocsoleus, Standing  Note: This exercise can place a lot of stress on your foot and ankle. Please complete this exercise only if specifically instructed by your caregiver.  Place the ball of your right / left foot on a step, keeping your other foot firmly on the same step. Hold on to the wall or a rail for balance. Slowly lift your other foot, allowing your body weight to press your heel down over the edge of the step. You should feel a stretch in your right / left calf. Hold this position for 10 seconds. Repeat this exercise with a slight bend in your right / left knee. Repeat 3 times. Complete this stretch 2 times per day.   STRENGTHENING EXERCISES - Plantar Fasciitis (Heel Spur Syndrome)  These exercises may help you when beginning to rehabilitate your injury.  They may resolve your symptoms with or without further involvement from your physician, physical therapist or athletic trainer. While completing these exercises, remember:  Muscles can gain both the endurance and the strength needed for everyday activities through controlled exercises. Complete these exercises as instructed by your physician, physical therapist or athletic trainer. Progress the resistance and repetitions only as guided.  STRENGTH - Towel Curls Sit in a chair positioned on a  non-carpeted surface. Place your foot on a towel, keeping your heel on the floor. Pull the towel toward your heel by only curling your toes. Keep your heel on the floor. Repeat 3 times. Complete this exercise 2 times per day.  STRENGTH - Ankle Inversion Secure one end of a rubber exercise band/tubing to a fixed object (table, pole). Loop the other end around your foot just before your toes. Place your fists between your knees. This will focus your strengthening at your ankle. Slowly, pull your big toe up and in, making sure the band/tubing is positioned to resist the entire motion. Hold this position for 10 seconds. Have your muscles resist the band/tubing as it slowly pulls your foot back to the starting position. Repeat 3 times. Complete this exercises 2 times per day.  Document Released: 03/20/2005 Document Revised: 06/12/2011 Document Reviewed: 07/02/2008 [facility] Patient Information 2014 [place], [place].

## 2022-04-26 ENCOUNTER — Ambulatory Visit: Payer: Medicare PPO | Admitting: Podiatry

## 2022-05-16 DIAGNOSIS — E782 Mixed hyperlipidemia: Secondary | ICD-10-CM | POA: Diagnosis not present

## 2022-05-16 DIAGNOSIS — N529 Male erectile dysfunction, unspecified: Secondary | ICD-10-CM | POA: Diagnosis not present

## 2022-05-16 DIAGNOSIS — I10 Essential (primary) hypertension: Secondary | ICD-10-CM | POA: Diagnosis not present

## 2022-05-16 DIAGNOSIS — J309 Allergic rhinitis, unspecified: Secondary | ICD-10-CM | POA: Diagnosis not present

## 2022-05-16 DIAGNOSIS — E1122 Type 2 diabetes mellitus with diabetic chronic kidney disease: Secondary | ICD-10-CM | POA: Diagnosis not present

## 2022-05-16 DIAGNOSIS — N182 Chronic kidney disease, stage 2 (mild): Secondary | ICD-10-CM | POA: Diagnosis not present

## 2022-05-16 DIAGNOSIS — N400 Enlarged prostate without lower urinary tract symptoms: Secondary | ICD-10-CM | POA: Diagnosis not present

## 2022-05-16 DIAGNOSIS — E1169 Type 2 diabetes mellitus with other specified complication: Secondary | ICD-10-CM | POA: Diagnosis not present

## 2022-05-23 ENCOUNTER — Ambulatory Visit (INDEPENDENT_AMBULATORY_CARE_PROVIDER_SITE_OTHER): Payer: Medicare PPO

## 2022-05-23 ENCOUNTER — Ambulatory Visit: Payer: Medicare PPO | Admitting: Podiatry

## 2022-05-23 DIAGNOSIS — M778 Other enthesopathies, not elsewhere classified: Secondary | ICD-10-CM

## 2022-05-23 DIAGNOSIS — M722 Plantar fascial fibromatosis: Secondary | ICD-10-CM | POA: Diagnosis not present

## 2022-05-23 MED ORDER — TRIAMCINOLONE ACETONIDE 10 MG/ML IJ SUSP
10.0000 mg | Freq: Once | INTRAMUSCULAR | Status: AC
  Administered 2022-05-23: 10 mg

## 2022-05-23 NOTE — Progress Notes (Signed)
Subjective: Chief Complaint  Patient presents with   Plantar Fasciitis    Rm 13  6 week follow up right pain in arch. Pt states pain has not improved since last visit. Same arch pain and intensity. Pt states he now has a sharp pain at the top of his foot and ankle and heel that started 1 week ago. Along with edema.    72 year old male presents the office today with above concerns.  States he still having pain.  Previous injection helped some.  Also states he does get some sharp pain to the foot as well.  No recent injury or changes otherwise.  Objective: AAO x3, NAD DP/PT pulses palpable bilaterally, CRT less than 3 seconds There is tenderness palpation on the plantar aspect calcaneus on insertion of the plantar fascial.  There is no pain with compression of calcaneus.  There is mild discomfort over the sinus tarsi.  No pain in the ankle joint itself.  Flexor, extensor tendons appear to be intact.  MMT 5/5.  Bilateral lower extremities.  No open lesions or pre-ulcerative lesions.  No pain with calf compression, swelling, warmth, erythema  Assessment: 72 year old male with plantar fasciitis, capsulitis  Plan: -All treatment options discussed with the patient including all alternatives, risks, complications.  -X-rays were obtained and reviewed with the patient. Scleoritc line oblique but no other evidence of acute fracture or stress fracture.  Clinically he has no pain with lateral compression of calcaneus so I doubt a stress fracture. -Steroid injection performed the plantar fascia today.  See procedure note below.  Continue stretching, icing on regular basis as well as ankle brace.  Continue shoes and good arch support.  If symptoms we will likely order MRI. -Patient encouraged to call the office with any questions, concerns, change in symptoms.   Procedure: Injection Tendon/Ligament Discussed alternatives, risks, complications and verbal consent was obtained.  Location: Right plantar  fascia at the glabrous junction; medial approach. Skin Prep: Alcohol. Injectate: 0.5cc 0.5% marcaine plain, 0.5 cc 2% lidocaine plain and, 1 cc kenalog 10. Disposition: Patient tolerated procedure well. Injection site dressed with a band-aid.  Post-injection care was discussed and return precautions discussed.   Return in about 4 weeks (around 06/20/2022).  Trula Slade DPM

## 2022-05-23 NOTE — Patient Instructions (Signed)

## 2022-06-02 ENCOUNTER — Telehealth: Payer: Self-pay | Admitting: Podiatry

## 2022-06-02 NOTE — Telephone Encounter (Signed)
I have left 3 messages for pt to call to reschedule the appt for 3.7.2024. this message I have canceled appt and asked the pt to call to r/s

## 2022-06-08 ENCOUNTER — Ambulatory Visit: Payer: Medicare PPO | Admitting: Podiatry

## 2023-02-06 DIAGNOSIS — E782 Mixed hyperlipidemia: Secondary | ICD-10-CM | POA: Diagnosis not present

## 2023-02-06 DIAGNOSIS — M519 Unspecified thoracic, thoracolumbar and lumbosacral intervertebral disc disorder: Secondary | ICD-10-CM | POA: Diagnosis not present

## 2023-02-06 DIAGNOSIS — Z1389 Encounter for screening for other disorder: Secondary | ICD-10-CM | POA: Diagnosis not present

## 2023-02-06 DIAGNOSIS — E1122 Type 2 diabetes mellitus with diabetic chronic kidney disease: Secondary | ICD-10-CM | POA: Diagnosis not present

## 2023-02-06 DIAGNOSIS — I10 Essential (primary) hypertension: Secondary | ICD-10-CM | POA: Diagnosis not present

## 2023-02-06 DIAGNOSIS — M25561 Pain in right knee: Secondary | ICD-10-CM | POA: Diagnosis not present

## 2023-02-06 DIAGNOSIS — N400 Enlarged prostate without lower urinary tract symptoms: Secondary | ICD-10-CM | POA: Diagnosis not present

## 2023-02-06 DIAGNOSIS — Z0000 Encounter for general adult medical examination without abnormal findings: Secondary | ICD-10-CM | POA: Diagnosis not present

## 2023-02-06 DIAGNOSIS — N182 Chronic kidney disease, stage 2 (mild): Secondary | ICD-10-CM | POA: Diagnosis not present

## 2023-08-09 DIAGNOSIS — Z1211 Encounter for screening for malignant neoplasm of colon: Secondary | ICD-10-CM | POA: Diagnosis not present

## 2023-08-09 DIAGNOSIS — N182 Chronic kidney disease, stage 2 (mild): Secondary | ICD-10-CM | POA: Diagnosis not present

## 2023-08-09 DIAGNOSIS — E782 Mixed hyperlipidemia: Secondary | ICD-10-CM | POA: Diagnosis not present

## 2023-08-09 DIAGNOSIS — M519 Unspecified thoracic, thoracolumbar and lumbosacral intervertebral disc disorder: Secondary | ICD-10-CM | POA: Diagnosis not present

## 2023-08-09 DIAGNOSIS — E1122 Type 2 diabetes mellitus with diabetic chronic kidney disease: Secondary | ICD-10-CM | POA: Diagnosis not present

## 2023-08-09 DIAGNOSIS — I10 Essential (primary) hypertension: Secondary | ICD-10-CM | POA: Diagnosis not present

## 2023-08-09 DIAGNOSIS — E119 Type 2 diabetes mellitus without complications: Secondary | ICD-10-CM | POA: Diagnosis not present

## 2023-08-15 DIAGNOSIS — Z1212 Encounter for screening for malignant neoplasm of rectum: Secondary | ICD-10-CM | POA: Diagnosis not present

## 2023-08-15 DIAGNOSIS — Z1211 Encounter for screening for malignant neoplasm of colon: Secondary | ICD-10-CM | POA: Diagnosis not present

## 2024-01-28 DIAGNOSIS — I10 Essential (primary) hypertension: Secondary | ICD-10-CM | POA: Diagnosis not present

## 2024-01-28 DIAGNOSIS — J309 Allergic rhinitis, unspecified: Secondary | ICD-10-CM | POA: Diagnosis not present

## 2024-01-28 DIAGNOSIS — Z5941 Food insecurity: Secondary | ICD-10-CM | POA: Diagnosis not present

## 2024-01-28 DIAGNOSIS — E785 Hyperlipidemia, unspecified: Secondary | ICD-10-CM | POA: Diagnosis not present

## 2024-01-28 DIAGNOSIS — N400 Enlarged prostate without lower urinary tract symptoms: Secondary | ICD-10-CM | POA: Diagnosis not present

## 2024-01-28 DIAGNOSIS — Z7984 Long term (current) use of oral hypoglycemic drugs: Secondary | ICD-10-CM | POA: Diagnosis not present

## 2024-01-28 DIAGNOSIS — E119 Type 2 diabetes mellitus without complications: Secondary | ICD-10-CM | POA: Diagnosis not present

## 2024-01-28 DIAGNOSIS — Z87891 Personal history of nicotine dependence: Secondary | ICD-10-CM | POA: Diagnosis not present

## 2024-01-28 DIAGNOSIS — Z5948 Other specified lack of adequate food: Secondary | ICD-10-CM | POA: Diagnosis not present

## 2024-02-12 DIAGNOSIS — N400 Enlarged prostate without lower urinary tract symptoms: Secondary | ICD-10-CM | POA: Diagnosis not present

## 2024-02-12 DIAGNOSIS — N182 Chronic kidney disease, stage 2 (mild): Secondary | ICD-10-CM | POA: Diagnosis not present

## 2024-02-12 DIAGNOSIS — Z23 Encounter for immunization: Secondary | ICD-10-CM | POA: Diagnosis not present

## 2024-02-12 DIAGNOSIS — M519 Unspecified thoracic, thoracolumbar and lumbosacral intervertebral disc disorder: Secondary | ICD-10-CM | POA: Diagnosis not present

## 2024-02-12 DIAGNOSIS — Z0000 Encounter for general adult medical examination without abnormal findings: Secondary | ICD-10-CM | POA: Diagnosis not present

## 2024-02-12 DIAGNOSIS — I10 Essential (primary) hypertension: Secondary | ICD-10-CM | POA: Diagnosis not present

## 2024-02-12 DIAGNOSIS — E1122 Type 2 diabetes mellitus with diabetic chronic kidney disease: Secondary | ICD-10-CM | POA: Diagnosis not present

## 2024-02-12 DIAGNOSIS — Z1331 Encounter for screening for depression: Secondary | ICD-10-CM | POA: Diagnosis not present

## 2024-02-12 DIAGNOSIS — E782 Mixed hyperlipidemia: Secondary | ICD-10-CM | POA: Diagnosis not present
